# Patient Record
Sex: Male | Born: 1990 | Smoking: Never smoker
Health system: Southern US, Community
[De-identification: ages and names within clinical notes are randomized; demographics above are authoritative.]

## PROBLEM LIST (undated history)

## (undated) DIAGNOSIS — D571 Sickle-cell disease without crisis: Secondary | ICD-10-CM

---

## 2014-11-01 ENCOUNTER — Emergency Department
Admission: EM | Admit: 2014-11-01 | Discharge: 2014-11-01 | Discharge status: Against Medical Advice | Payer: Self-pay | Attending: Emergency Medicine | Admitting: Emergency Medicine

## 2014-11-01 DIAGNOSIS — F10129 Alcohol abuse with intoxication, unspecified: Secondary | ICD-10-CM | POA: Insufficient documentation

## 2014-11-01 NOTE — ED Notes (Signed)
Pt presents to ER alert and heavily intoxicated. Pt states he was dropped off by the police. Pt is agitated and combative.

## 2015-01-06 ENCOUNTER — Emergency Department: Payer: Self-pay

## 2015-01-06 ENCOUNTER — Encounter: Payer: Self-pay | Admitting: *Deleted

## 2015-01-06 ENCOUNTER — Emergency Department
Admission: EM | Admit: 2015-01-06 | Discharge: 2015-01-07 | Disposition: A | Discharge status: Home / Self Care | Payer: Self-pay | Attending: Student | Admitting: Student

## 2015-01-06 DIAGNOSIS — J029 Acute pharyngitis, unspecified: Secondary | ICD-10-CM

## 2015-01-06 DIAGNOSIS — J039 Acute tonsillitis, unspecified: Secondary | ICD-10-CM

## 2015-01-06 HISTORY — DX: Sickle-cell disease without crisis: D57.1

## 2015-01-06 LAB — COMPREHENSIVE METABOLIC PANEL
ALT: 32 U/L (ref 17–63)
ANION GAP: 11 (ref 5–15)
AST: 43 U/L — AB (ref 15–41)
Albumin: 4 g/dL (ref 3.5–5.0)
Alkaline Phosphatase: 60 U/L (ref 38–126)
BUN: 16 mg/dL (ref 6–20)
CHLORIDE: 95 mmol/L — AB (ref 101–111)
CO2: 26 mmol/L (ref 22–32)
Calcium: 8.8 mg/dL — ABNORMAL LOW (ref 8.9–10.3)
Creatinine, Ser: 1.02 mg/dL (ref 0.61–1.24)
Glucose, Bld: 113 mg/dL — ABNORMAL HIGH (ref 65–99)
POTASSIUM: 3.2 mmol/L — AB (ref 3.5–5.1)
Sodium: 132 mmol/L — ABNORMAL LOW (ref 135–145)
TOTAL PROTEIN: 7.4 g/dL (ref 6.5–8.1)
Total Bilirubin: 1.6 mg/dL — ABNORMAL HIGH (ref 0.3–1.2)

## 2015-01-06 LAB — CBC WITH DIFFERENTIAL/PLATELET
BASOS ABS: 0.1 10*3/uL (ref 0–0.1)
Basophils Relative: 0 %
Eosinophils Absolute: 0 10*3/uL (ref 0–0.7)
HCT: 41.7 % (ref 40.0–52.0)
Hemoglobin: 14.1 g/dL (ref 13.0–18.0)
Lymphs Abs: 1 10*3/uL (ref 1.0–3.6)
MCH: 29.1 pg (ref 26.0–34.0)
MCHC: 33.9 g/dL (ref 32.0–36.0)
MCV: 86 fL (ref 80.0–100.0)
MONO ABS: 2.6 10*3/uL — AB (ref 0.2–1.0)
Neutro Abs: 23.4 10*3/uL — ABNORMAL HIGH (ref 1.4–6.5)
Neutrophils Relative %: 86 %
PLATELETS: 190 10*3/uL (ref 150–440)
RBC: 4.85 MIL/uL (ref 4.40–5.90)
RDW: 13.6 % (ref 11.5–14.5)
WBC: 27.2 10*3/uL — ABNORMAL HIGH (ref 3.8–10.6)

## 2015-01-06 LAB — LIPASE, BLOOD: LIPASE: 22 U/L (ref 22–51)

## 2015-01-06 MED ORDER — SODIUM CHLORIDE 0.9 % IV BOLUS (SEPSIS)
1000.0000 mL | Freq: Once | INTRAVENOUS | Status: AC
  Administered 2015-01-06: 1000 mL via INTRAVENOUS

## 2015-01-06 MED ORDER — KETOROLAC TROMETHAMINE 30 MG/ML IJ SOLN
30.0000 mg | Freq: Once | INTRAMUSCULAR | Status: AC
  Administered 2015-01-06: 30 mg via INTRAVENOUS
  Filled 2015-01-06: qty 1

## 2015-01-06 MED ORDER — ACETAMINOPHEN 500 MG PO TABS
1000.0000 mg | ORAL_TABLET | Freq: Once | ORAL | Status: AC
  Administered 2015-01-06: 1000 mg via ORAL
  Filled 2015-01-06: qty 2

## 2015-01-06 MED ORDER — IOHEXOL 300 MG/ML  SOLN
75.0000 mL | Freq: Once | INTRAMUSCULAR | Status: AC | PRN
  Administered 2015-01-06: 75 mL via INTRAVENOUS

## 2015-01-06 NOTE — ED Notes (Signed)
Pt to call mother when done  [phone]

## 2015-01-06 NOTE — ED Notes (Signed)
Per patient's report, he has had a fever for 3 days, vomited the first day but not since. Patient c/o headache, neck pain, lower back pain, general body aches. Patient c/o increased neck pain when turning head to the right. Patient denies photophobia.

## 2015-01-06 NOTE — ED Provider Notes (Signed)
[facility] Emergency Department Provider Note  ____________________________________________  Time seen: Approximately 8:45 PM  I have reviewed the triage vital signs and the nursing notes.   HISTORY  Chief Complaint Fever    HPI Douglas Copeland is a 24 y.o. male with no chronic medical problems presents for evaluation of 2 days gradual onset sore throat and fever, constant since onset and currently moderate in severity. No exacerbating factors. No cough, no runny nose, no vomiting, diarrhea, dysuria. No known sick contacts. He is complaining of myalgias including bilateral neck pain, lower back pain. Currently his symptoms are moderate. Pain in the neck is worse when he turns his head to the right. No photophobia. No chest pain or difficulty breathing.   Past Medical History  Diagnosis Date  . Sickle cell anemia     traits    There are no active problems to display for this patient.   History reviewed. No pertinent past surgical history.  No current outpatient prescriptions on file.  Allergies Review of patient's allergies indicates no known allergies.  No family history on file.  Social History Social History  Substance Use Topics  . Smoking status: Never Smoker   . Smokeless tobacco: None  . Alcohol Use: Yes     Comment: occasionally    Review of Systems Constitutional: + fever/chills Eyes: No visual changes. ENT: + sore throat. Cardiovascular: Denies chest pain. Respiratory: Denies shortness of breath. Gastrointestinal: No abdominal pain.  No nausea, no vomiting.  No diarrhea.  No constipation. Genitourinary: Negative for dysuria. Musculoskeletal: Negative for back pain. Skin: Negative for rash. Neurological: Negative for headaches, focal weakness or numbness.  10-point ROS otherwise negative.  ____________________________________________   PHYSICAL EXAM:  VITAL SIGNS: ED Triage Vitals  Enc Vitals Group     BP 01/06/15  2028 98/62 mmHg     Pulse Rate 01/06/15 2028 95     Resp --      Temp 01/06/15 2028 100.9 F (38.3 C)     Temp src --      SpO2 01/06/15 2028 96 %     Weight 01/06/15 2028 116 lb (52.617 kg)     Height 01/06/15 2028 5' (1.524 m)     Head Cir --      Peak Flow --      Pain Score 01/06/15 2030 8     Pain Loc --      Pain Edu? --      Excl. in GC? --     Constitutional: Alert and oriented. Well appearing and in no acute distress. Eyes: Conjunctivae are normal. PERRL. EOMI. Head: Atraumatic. Nose: No congestion/rhinnorhea. Mouth/Throat: Mucous membranes are moist.  Bilateral tonsillar enlargement with erythema, small white exudate, no uvular deviation or asymmetry of the tonsils. Speaking with a slightly muffled voice but handling secretions well, tolerating by mouth intake. Neck: No stridor.  Supple without meningismus. Tender anterior cervical lymphadenopathy. Cardiovascular: Normal rate, regular rhythm. Grossly normal heart sounds.  Good peripheral circulation. Respiratory: Normal respiratory effort.  No retractions. Lungs CTAB. Gastrointestinal: Soft and nontender. No distention. No abdominal bruits. No CVA tenderness. Genitourinary: deferred Musculoskeletal: No lower extremity tenderness nor edema.  No joint effusions. Neurologic:  Normal speech and language. No gross focal neurologic deficits are appreciated. No gait instability. Skin:  Skin is warm, dry and intact. No rash noted. Psychiatric: Mood and affect are normal. Speech and behavior are normal.  ____________________________________________   LABS (all labs ordered are listed, but only abnormal results  are displayed)  Labs Reviewed  CBC WITH DIFFERENTIAL/PLATELET - Abnormal; Notable for the following:    WBC 27.2 (*)    Neutro Abs 23.4 (*)    Monocytes Absolute 2.6 (*)    All other components within normal limits  COMPREHENSIVE METABOLIC PANEL - Abnormal; Notable for the following:    Sodium 132 (*)    Potassium  3.2 (*)    Chloride 95 (*)    Glucose, Bld 113 (*)    Calcium 8.8 (*)    AST 43 (*)    Total Bilirubin 1.6 (*)    All other components within normal limits  LIPASE, BLOOD   ____________________________________________  EKG  none ____________________________________________  RADIOLOGY  CT soft tissue neck with contrast IMPRESSION: 1. Findings most consistent with acute tonsillitis. No tonsillar or peritonsillar abscess. 2. Mildly enlarged bilateral level 2 adenopathy, likely reactive in nature.  ____________________________________________   PROCEDURES  Procedure(s) performed: None  Critical Care performed: No  ____________________________________________   INITIAL IMPRESSION / ASSESSMENT AND PLAN / ED COURSE  Pertinent labs & imaging results that were available during my care of the patient were reviewed by me and considered in my medical decision making (see chart for details).  Douglas Copeland is a 24 y.o. male with no chronic medical problems presents for evaluation of 2 days gradual onset sore throat and fever, constant since onset and currently moderate in severity. On exam, he is nontoxic appearing and in no acute distress. Febrile on arrival with a temperature of 100.9 on arrival which improved to ~99 with antipyretics. Neck is supple without meningismus, he has full lateral range of motion as well as range of motion in the cranial/caudal planes. Doubt meningitis. Labs are notable for severe leukocytosis at 27K and given his exam finding, CT soft tissue neck was obtained which show findings most consistent with acute tonsillitis no tonsillar, no peritonsillar abscess, no retropharyngeal abscess. Labs otherwise notable for very mild hyponatremia and hypokalemia. Mild nonspecific elevations of AST at and T bili. He has no abdominal pain, no abdominal tenderness and despite what was noted in triage note, he reports that he has not had any vomiting this illness. Rapid  strep negative however Centor score is 4. We'll treat with Augmentin. I discussed meticulous return precautions with him and he is comfortable with the discharge plan. He feels much better after fluids. He will follow up with ENT. ____________________________________________   FINAL CLINICAL IMPRESSION(S) / ED DIAGNOSES  Final diagnoses:  Sore throat  Acute tonsillitis      Gayla Doss, MD 01/07/15 [phone]

## 2015-01-07 LAB — POCT RAPID STREP A: STREPTOCOCCUS, GROUP A SCREEN (DIRECT): NEGATIVE

## 2015-01-07 MED ORDER — AMOXICILLIN-POT CLAVULANATE 875-125 MG PO TABS: 1.0000 | ORAL_TABLET | Freq: Two times a day (BID) | ORAL | Status: AC

## 2015-01-07 MED ORDER — IBUPROFEN 600 MG PO TABS: 600.0000 mg | ORAL_TABLET | Freq: Four times a day (QID) | ORAL | Status: AC | PRN

## 2015-01-07 MED ORDER — AMOXICILLIN-POT CLAVULANATE 875-125 MG PO TABS
1.0000 | ORAL_TABLET | Freq: Once | ORAL | Status: AC
  Administered 2015-01-07: 1 via ORAL
  Filled 2015-01-07: qty 1

## 2015-01-07 NOTE — ED Notes (Signed)
POC rapid strep negative 

## 2015-01-07 NOTE — Discharge Instructions (Signed)
You were seen in the emergency department for sore throat and fever. You were found to have inflammation of your tonsils. This is called tonsillitis. Take antibiotics as prescribed and follow-up with our ear nose and throat doctors as soon as possible. Return immediately to the emergency department if you develop worsening throat pain, throat swelling, difficulty breathing, vomiting, inability to swallow or spit or drink water, chest pain, difficulty breathing, vomiting, abdominal pain, numbness or weakness, severe headache, worsening neck pain, any neck stiffness, rash or for any other concerns.

## 2016-05-10 IMAGING — CT CT NECK W/ CM
2 of 3 series · 8 of 14 positions shown, 9 images · IV contrast (omnipaque)
Comparison: None.

CLINICAL DATA: Initial evaluation for 3 day history of sore throat
and fever.

EXAM:
CT NECK WITH CONTRAST
TECHNIQUE: Multidetector CT imaging of the neck was performed using the
standard protocol following the bolus administration of intravenous
contrast.
CONTRAST:  75mL OMNIPAQUE IOHEXOL 300 MG/ML  SOLN

[Series 2: axial neck · axial · 0.45mm/px · z∈[-247,-101]mm · 4 of 123 slices shown]
[im 25/123  bone]
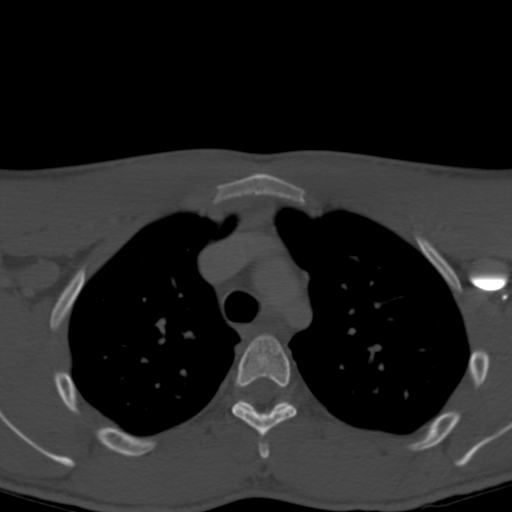
[im 49/123  bone]
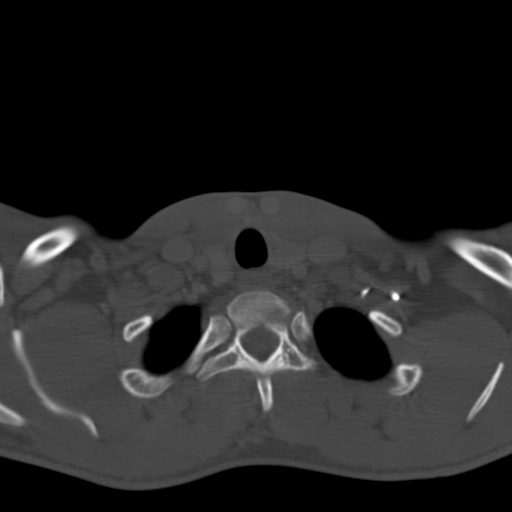
[im 74/123  bone]
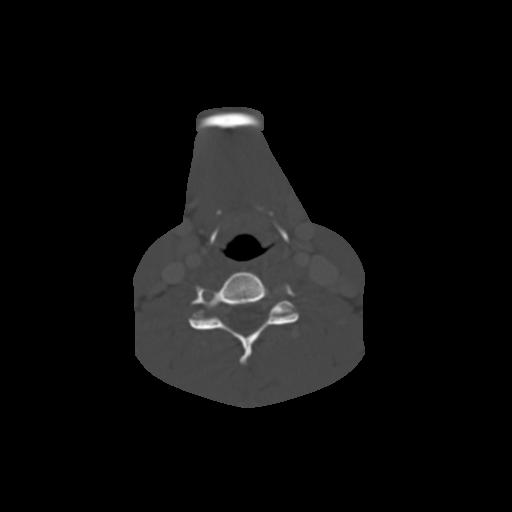
[im 98/123  bone]
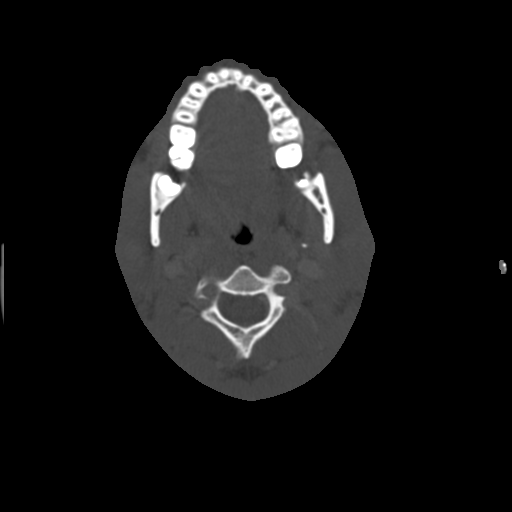

[Series 7: ax oropharynx · axial · 0.43mm/px · z∈[-273,-122]mm · 4 of 131 slices shown, 5 images]
[im 27/131  soft-tissue]
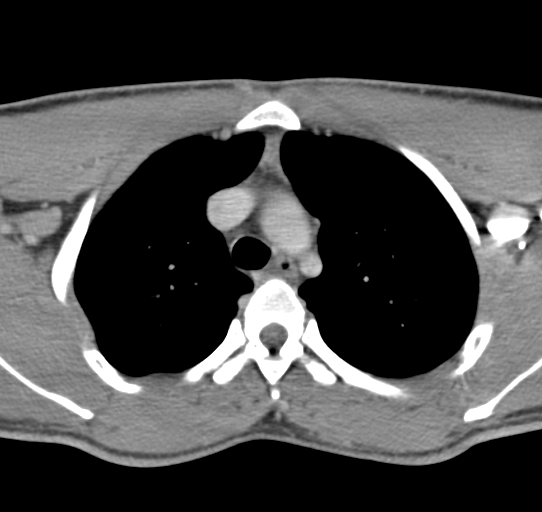
[im 27/131  bone]
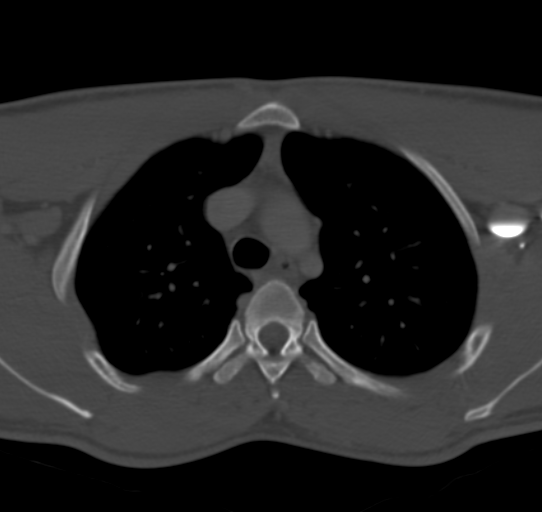
[im 53/131  bone]
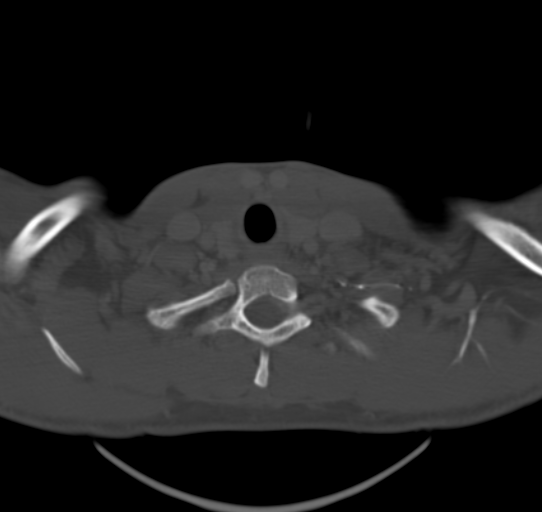
[im 79/131  bone]
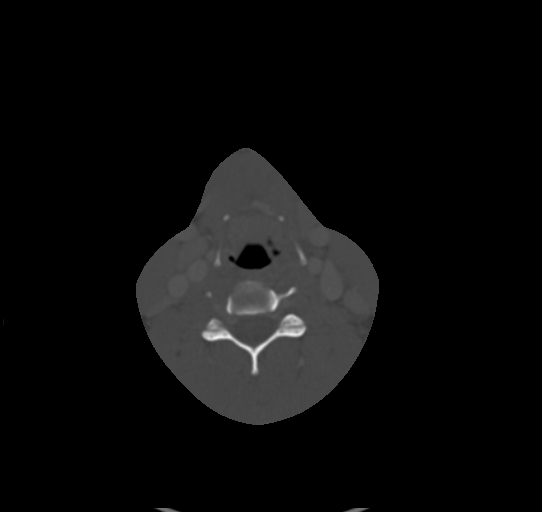
[im 105/131  bone]
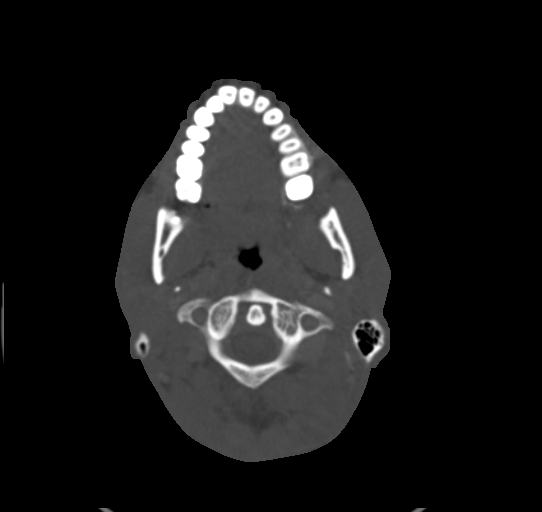

[8 of 14 positions shown; findings below may reference images not displayed]

FINDINGS: Visualized portions of the brain are unremarkable. Visualized globes
and orbits within normal limits.

Visualized paranasal sinuses are clear. No mastoid effusion. Middle
ear cavities are clear.

Salivary glands including the parotid glands and submandibular
glands are within normal limits.

Oral cavity within normal limits. No significant dental disease.
Palatine tonsils are prominent and enlarged bilaterally and mildly
hyper enhancing, suggesting acute tonsillitis. The enlarged tonsils
abut at the midline. Parapharyngeal fat remains well preserved. No
definable tonsillar or peritonsillar abscess. Lingual tonsils
prominent as well.

No retropharyngeal fluid collection. Epiglottis normal. Remainder of
the hypopharynx and supraglottic larynx within normal limits. True
vocal cords symmetric and normal bilaterally. Subglottic airway
clear.

Thyroid gland normal.

Mildly enlarged bilateral level 2 lymph nodes noted, measuring up to
13 mm on the right and 12 mm on the left, likely reactive. No other
adenopathy within the neck.

Visualized superior mediastinum within normal limits.

Visualized lungs are clear. Normal intravascular enhancement seen
throughout the neck.

No acute osseous abnormality. No worrisome lytic or blastic osseous
lesions.
IMPRESSION: 1. Findings most consistent with acute tonsillitis. No tonsillar or
peritonsillar abscess.
2. Mildly enlarged bilateral level 2 adenopathy, likely reactive in
nature.
